# Patient Record
Sex: Male | Born: 1970 | Race: White | Marital: Single
Health system: Southern US, Community
[De-identification: ages and names within clinical notes are randomized; demographics above are authoritative.]

## PROBLEM LIST (undated history)

## (undated) DIAGNOSIS — F419 Anxiety disorder, unspecified: Secondary | ICD-10-CM

## (undated) DIAGNOSIS — I10 Essential (primary) hypertension: Secondary | ICD-10-CM

## (undated) DIAGNOSIS — E7800 Pure hypercholesterolemia, unspecified: Secondary | ICD-10-CM

---

## 2014-06-17 ENCOUNTER — Other Ambulatory Visit: Payer: Self-pay | Admitting: Otolaryngology

## 2014-06-17 DIAGNOSIS — H905 Unspecified sensorineural hearing loss: Secondary | ICD-10-CM

## 2014-06-17 DIAGNOSIS — H903 Sensorineural hearing loss, bilateral: Secondary | ICD-10-CM

## 2014-07-09 ENCOUNTER — Ambulatory Visit
Admission: RE | Admit: 2014-07-09 | Discharge: 2014-07-09 | Disposition: A | Discharge status: Home / Self Care | Payer: BC Managed Care – PPO | Source: Ambulatory Visit | Attending: Otolaryngology | Admitting: Otolaryngology

## 2014-07-09 DIAGNOSIS — H903 Sensorineural hearing loss, bilateral: Secondary | ICD-10-CM

## 2014-07-09 DIAGNOSIS — H905 Unspecified sensorineural hearing loss: Secondary | ICD-10-CM

## 2014-07-09 MED ORDER — GADOBENATE DIMEGLUMINE 529 MG/ML IV SOLN
15.0000 mL | Freq: Once | INTRAVENOUS | Status: AC | PRN
  Administered 2014-07-09: 15 mL via INTRAVENOUS

## 2017-08-08 ENCOUNTER — Encounter (HOSPITAL_COMMUNITY): Payer: Self-pay

## 2017-08-08 ENCOUNTER — Emergency Department (HOSPITAL_COMMUNITY)
Admission: EM | Admit: 2017-08-08 | Discharge: 2017-08-08 | Disposition: A | Discharge status: Home / Self Care | Payer: BC Managed Care – PPO | Attending: Emergency Medicine | Admitting: Emergency Medicine

## 2017-08-08 ENCOUNTER — Emergency Department (HOSPITAL_COMMUNITY): Payer: BC Managed Care – PPO

## 2017-08-08 DIAGNOSIS — R0789 Other chest pain: Secondary | ICD-10-CM | POA: Insufficient documentation

## 2017-08-08 DIAGNOSIS — I10 Essential (primary) hypertension: Secondary | ICD-10-CM | POA: Insufficient documentation

## 2017-08-08 DIAGNOSIS — R079 Chest pain, unspecified: Secondary | ICD-10-CM | POA: Diagnosis present

## 2017-08-08 HISTORY — DX: Pure hypercholesterolemia, unspecified: E78.00

## 2017-08-08 HISTORY — DX: Essential (primary) hypertension: I10

## 2017-08-08 HISTORY — DX: Anxiety disorder, unspecified: F41.9

## 2017-08-08 LAB — I-STAT TROPONIN, ED
TROPONIN I, POC: 0 ng/mL (ref 0.00–0.08)
Troponin i, poc: 0 ng/mL (ref 0.00–0.08)

## 2017-08-08 LAB — BASIC METABOLIC PANEL
Anion gap: 7 (ref 5–15)
BUN: 11 mg/dL (ref 6–20)
CHLORIDE: 104 mmol/L (ref 101–111)
CO2: 28 mmol/L (ref 22–32)
CREATININE: 0.96 mg/dL (ref 0.61–1.24)
Calcium: 9.3 mg/dL (ref 8.9–10.3)
GFR calc Af Amer: >60 mL/min (ref >60)
GFR calc non Af Amer: >60 mL/min (ref >60)
Glucose, Bld: 92 mg/dL (ref 65–99)
POTASSIUM: 3.9 mmol/L (ref 3.5–5.1)
SODIUM: 139 mmol/L (ref 135–145)

## 2017-08-08 LAB — CBC
HCT: 39.6 % (ref 39.0–52.0)
Hemoglobin: 13.6 g/dL (ref 13.0–17.0)
MCH: 29 pg (ref 26.0–34.0)
MCHC: 34.3 g/dL (ref 30.0–36.0)
MCV: 84.4 fL (ref 78.0–100.0)
PLATELETS: 199 10*3/uL (ref 150–400)
RBC: 4.69 MIL/uL (ref 4.22–5.81)
RDW: 13.2 % (ref 11.5–15.5)
WBC: 6.4 10*3/uL (ref 4.0–10.5)

## 2017-08-08 NOTE — ED Triage Notes (Signed)
Patient complains of central CP x 1 day, states that the pain started yesterday afternoon with no radiation, worse with exertion, states that he thinks may be related to his anxiety

## 2017-08-08 NOTE — Discharge Instructions (Addendum)
Your blood work, EKG and chest x-ray were reassuring today.  Please follow-up with your regular doctor in regards to your ER visit today.  Return to the emergency department for any new or concerning symptoms like worsening chest pain, trouble breathing, chest pain that radiates to the left arm or jaw, chest pain with sweating, chest pain with nausea/vomiting.

## 2017-08-08 NOTE — ED Provider Notes (Signed)
[facility] EMERGENCY DEPARTMENT Provider Note   CSN: [id number] Arrival date & time: 08/08/17  0935     History   Chief Complaint Chief Complaint  Patient presents with  . Chest Pain    HPI Mason Armstrong is a 47 y.o. male.  HPI   Mason Armstrong is a 47 year old male with a history of hypertension and hyperlipidemia who presents to the emergency department for evaluation of central chest pain.  Patient states his symptoms began to gradually yesterday afternoon around 4 PM.  Reports pain is substernal and feels dull in nature, about 2/10 in severity.  Pain does not radiate.  He states it becomes sharp with pressing on the chest wall or with bending/twisting.  He denies any recent strenuous physical activity or chest wall trauma.  He denies associated shortness of breath, nausea/vomiting, diaphoresis or lightheadedness.  States that he was given 324 mg aspirin which did not change his symptoms.  He denies history of PE/DVT, leg swelling or calf tenderness, recent surgery or immobilization, active cancer.  Denies recent fever, chills and denies change in symptoms with sitting up or laying flat.  He denies tobacco use.  Denies family history of MI that he is aware of.  Denies history of exertional chest pain.  Denies any recent recreational drug use.    Past Medical History:  Diagnosis Date  . Anxiety   . High cholesterol   . Hypertension     There are no active problems to display for this patient.   History reviewed. No pertinent surgical history.      Home Medications    Prior to Admission medications   Not on File    Family History No family history on file.  Social History Social History   Tobacco Use  . Smoking status: Not on file  Substance Use Topics  . Alcohol use: Not on file  . Drug use: Not on file     Allergies   Patient has no known allergies.   Review of Systems Review of Systems  Constitutional: Negative for chills and fever.   HENT: Negative for congestion and sore throat.   Respiratory: Negative for cough and shortness of breath.   Cardiovascular: Positive for chest pain. Negative for palpitations and leg swelling.  Gastrointestinal: Negative for abdominal pain, diarrhea, nausea and vomiting.  Genitourinary: Negative for difficulty urinating and dysuria.  Musculoskeletal: Negative for back pain.  Skin: Negative for rash.  Neurological: Negative for syncope, weakness, light-headedness, numbness and headaches.  Psychiatric/Behavioral: Negative for agitation.     Physical Exam Updated Vital Signs BP (!) 133/92 (BP Location: Left Arm)   Pulse 67   Temp 98.3 F (36.8 C) (Oral)   Resp 18   SpO2 100%   Physical Exam  Constitutional: He is oriented to person, place, and time. He appears well-developed and well-nourished. No distress.  Sitting at bedside in no apparent distress, nontoxic-appearing.  HENT:  Head: Normocephalic and atraumatic.  Mouth/Throat: Oropharynx is clear and moist. No oropharyngeal exudate.  Eyes: Pupils are equal, round, and reactive to light. Conjunctivae are normal. Right eye exhibits no discharge. Left eye exhibits no discharge.  Neck: Normal range of motion. Neck supple. No JVD present. No tracheal deviation present.  Cardiovascular: Normal rate, regular rhythm and intact distal pulses. Exam reveals no friction rub.  No murmur heard. Pulmonary/Chest: Effort normal and breath sounds normal. No stridor. No respiratory distress. He has no wheezes. He has no rales.  Sternum somewhat tender to  palpation.  No overlying bruise, rash.  Abdominal: Soft. Bowel sounds are normal. There is no tenderness. There is no guarding.  Musculoskeletal: Normal range of motion.  No leg swelling or calf tenderness.  Neurological: He is alert and oriented to person, place, and time. Coordination normal.  Skin: Skin is warm and dry. Capillary refill takes less than 2 seconds. He is not diaphoretic.    Psychiatric: He has a normal mood and affect. His behavior is normal.  Nursing note and vitals reviewed.    ED Treatments / Results  Labs (all labs ordered are listed, but only abnormal results are displayed) Labs Reviewed  BASIC METABOLIC PANEL  CBC  I-STAT TROPONIN, ED  I-STAT TROPONIN, ED    EKG EKG Interpretation  Date/Time:  Wednesday Aug 08 2017 09:39:53 EDT Ventricular Rate:  78 PR Interval:  152 QRS Duration: 82 QT Interval:  376 QTC Calculation: 428 R Axis:   80 Text Interpretation:  Normal sinus rhythm Artifact Otherwise within normal limits Confirmed by Gerhard Munch [phone]) on 08/08/2017 4:51:29 PM   Radiology Dg Chest 2 View  Result Date: 08/08/2017 CLINICAL DATA:  Midline chest tightness and pain beginning last evening with shortness breath. Nausea and vomiting. Symptoms have since subsided. EXAM: CHEST - 2 VIEW COMPARISON:  None. FINDINGS: The heart size and mediastinal contours are within normal limits. Both lungs are clear. The visualized skeletal structures are unremarkable. IMPRESSION: Negative two view chest x-ray Electronically Signed   By: Marin Roberts M.D.   On: 08/08/2017 10:34    Procedures Procedures (including critical care time)  Medications Ordered in ED Medications - No data to display   Initial Impression / Assessment and Plan / ED Course  I have reviewed the triage vital signs and the nursing notes.  Pertinent labs & imaging results that were available during my care of the patient were reviewed by me and considered in my medical decision making (see chart for details).     Patient will be discharged with recommendation to follow-up with his PCP regarding today's ER visit.  Chest pain is unlikely ACS given presentation, negative delta troponin and EKG without acute abnormality.  His HEART  score is 3. He is PERC negative. No pulse deficit, neurologic symptoms, new murmur, radiation to the back, doubt TAD. CXR without acute  abnormality, no pneumonia, pneumothorax or pneumomediastinum. Patient was afebrile and in no acute distress, therefore doubt esophageal perforation. His pain sounds musculoskeletal in nature given worsened with movement and palpation of the chest wall. Have counseled him to use NSAIDs at home. Discussed strict return precautions and he agrees and voices understanding and appears reliable for follow up.   Final Clinical Impressions(s) / ED Diagnoses   Final diagnoses:  Atypical chest pain    ED Discharge Orders    None       Lawrence Marseilles 08/08/17 1925    Gerhard Munch, MD 08/08/17 2200

## 2017-10-03 ENCOUNTER — Other Ambulatory Visit: Payer: Self-pay | Admitting: Family Medicine

## 2017-10-03 ENCOUNTER — Ambulatory Visit
Admission: RE | Admit: 2017-10-03 | Discharge: 2017-10-03 | Disposition: A | Discharge status: Home / Self Care | Payer: BC Managed Care – PPO | Source: Ambulatory Visit | Attending: Family Medicine | Admitting: Family Medicine

## 2017-10-03 DIAGNOSIS — M79672 Pain in left foot: Secondary | ICD-10-CM

## 2018-12-17 IMAGING — DX DG FOOT COMPLETE 3+V*L*
3 series · 3 of 3 positions shown · non-contrast
Comparison: None.

CLINICAL DATA: Patient fell, twisted, pain.

EXAM:
LEFT FOOT - COMPLETE 3+ VIEW

[dg foot complete left (1 of 3)]
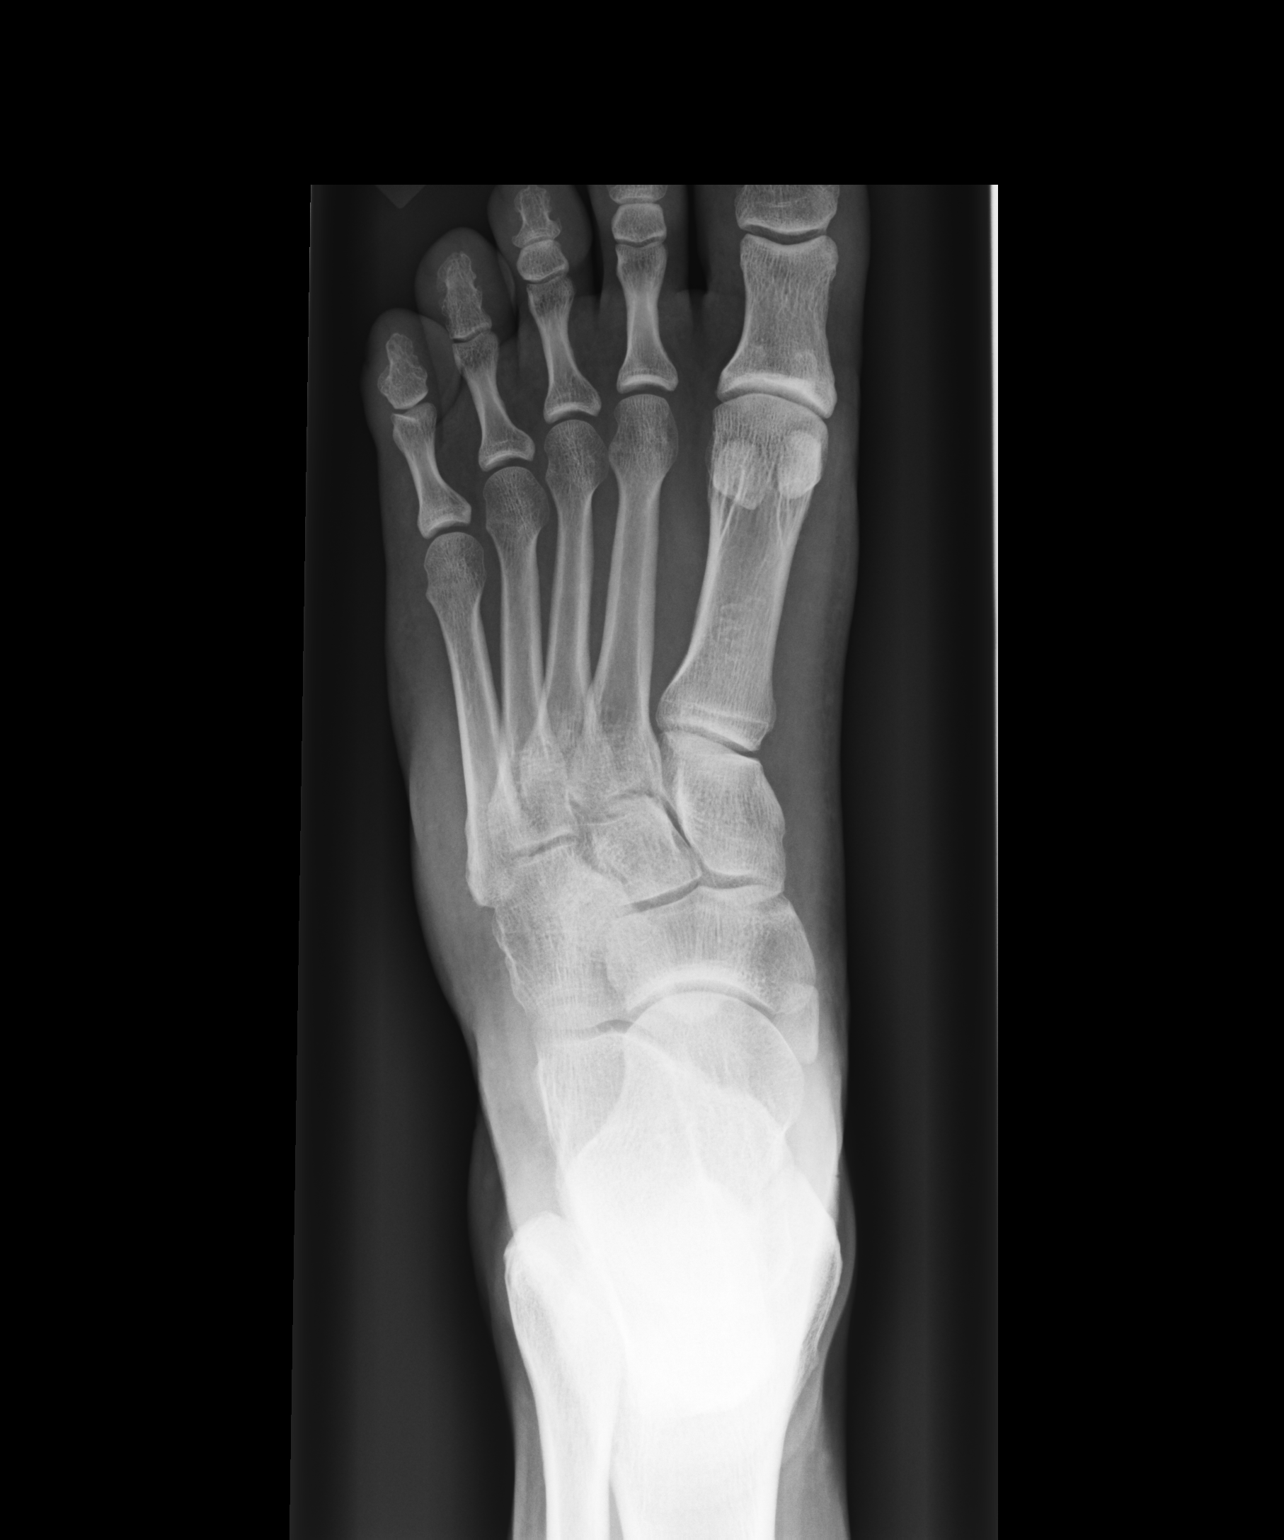

[dg foot complete left (2 of 3)]
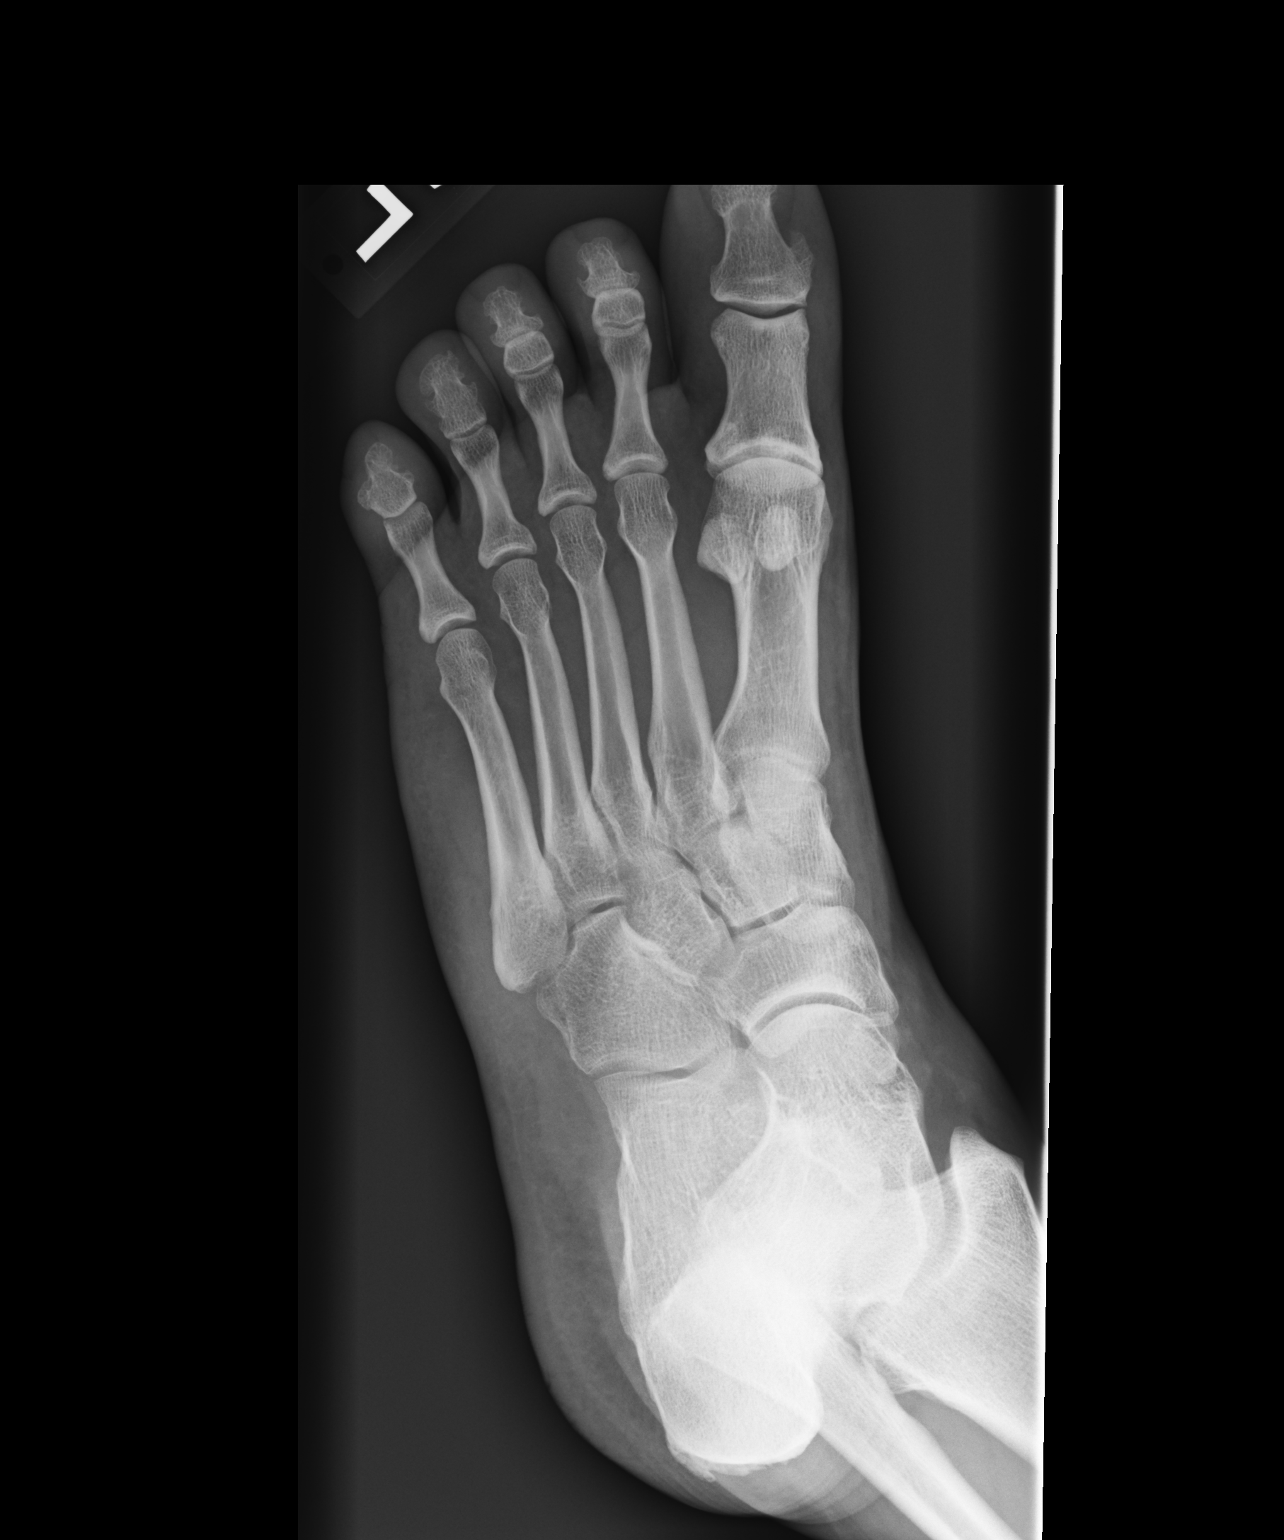

[dg foot complete left (3 of 3)]
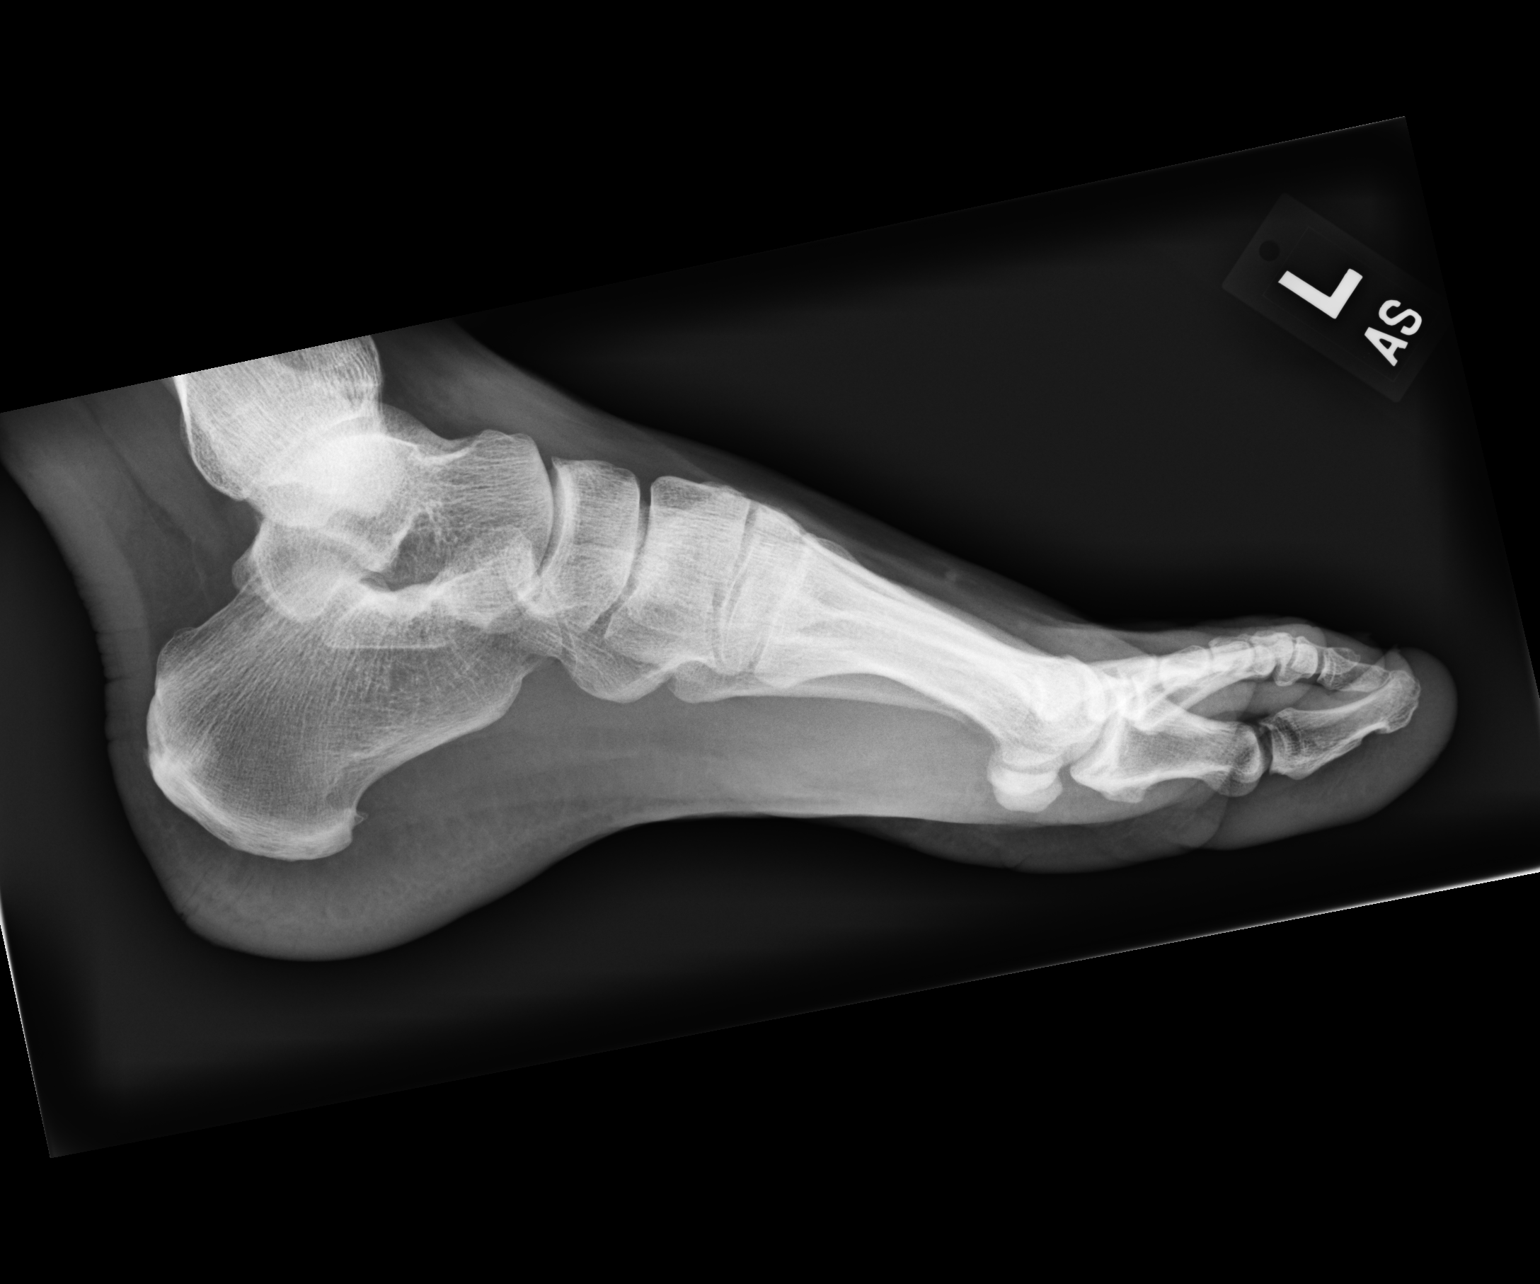

[3 of 3 positions shown; findings below may reference images not displayed]

FINDINGS: There is no evidence of fracture or dislocation. There is no
evidence of arthropathy or other focal bone abnormality. Lateral
soft tissue swelling.
IMPRESSION: Negative for fracture.
